# Patient Record
Sex: Female | Born: 1975 | Race: Black or African American | Hispanic: No | Marital: Single
Health system: Southern US, Community
[De-identification: ages and names within clinical notes are randomized; demographics above are authoritative.]

## PROBLEM LIST (undated history)

## (undated) DIAGNOSIS — E079 Disorder of thyroid, unspecified: Secondary | ICD-10-CM

## (undated) DIAGNOSIS — G43909 Migraine, unspecified, not intractable, without status migrainosus: Secondary | ICD-10-CM

## (undated) DIAGNOSIS — F410 Panic disorder [episodic paroxysmal anxiety] without agoraphobia: Secondary | ICD-10-CM

## (undated) HISTORY — PX: WISDOM TOOTH EXTRACTION: SHX21

## (undated) HISTORY — PX: ESSURE TUBAL LIGATION: SUR464

---

## 2005-09-26 ENCOUNTER — Emergency Department (HOSPITAL_COMMUNITY): Admission: EM | Admit: 2005-09-26 | Discharge: 2005-09-26 | Payer: Self-pay | Admitting: Emergency Medicine

## 2005-12-08 ENCOUNTER — Emergency Department (HOSPITAL_COMMUNITY): Admission: EM | Admit: 2005-12-08 | Discharge: 2005-12-08 | Payer: Self-pay | Admitting: Emergency Medicine

## 2006-02-26 ENCOUNTER — Emergency Department (HOSPITAL_COMMUNITY): Admission: EM | Admit: 2006-02-26 | Discharge: 2006-02-26 | Payer: Self-pay | Admitting: Family Medicine
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
## 2014-08-19 ENCOUNTER — Emergency Department (HOSPITAL_COMMUNITY)
Admission: EM | Admit: 2014-08-19 | Discharge: 2014-08-19 | Disposition: A | Discharge status: Home / Self Care | Payer: Medicaid Other | Attending: Emergency Medicine | Admitting: Emergency Medicine

## 2014-08-19 ENCOUNTER — Encounter (HOSPITAL_COMMUNITY): Payer: Self-pay

## 2014-08-19 DIAGNOSIS — Z8679 Personal history of other diseases of the circulatory system: Secondary | ICD-10-CM | POA: Diagnosis not present

## 2014-08-19 DIAGNOSIS — Z5900 Homelessness unspecified: Secondary | ICD-10-CM

## 2014-08-19 DIAGNOSIS — E039 Hypothyroidism, unspecified: Secondary | ICD-10-CM | POA: Diagnosis not present

## 2014-08-19 DIAGNOSIS — Z8659 Personal history of other mental and behavioral disorders: Secondary | ICD-10-CM | POA: Insufficient documentation

## 2014-08-19 DIAGNOSIS — G44209 Tension-type headache, unspecified, not intractable: Secondary | ICD-10-CM | POA: Insufficient documentation

## 2014-08-19 DIAGNOSIS — R51 Headache: Secondary | ICD-10-CM | POA: Diagnosis present

## 2014-08-19 DIAGNOSIS — Z79899 Other long term (current) drug therapy: Secondary | ICD-10-CM | POA: Diagnosis not present

## 2014-08-19 HISTORY — DX: Migraine, unspecified, not intractable, without status migrainosus: G43.909

## 2014-08-19 HISTORY — DX: Panic disorder (episodic paroxysmal anxiety): F41.0

## 2014-08-19 HISTORY — DX: Disorder of thyroid, unspecified: E07.9

## 2014-08-19 MED ORDER — DIPHENHYDRAMINE HCL 25 MG PO CAPS
50.0000 mg | ORAL_CAPSULE | Freq: Once | ORAL | Status: AC
  Administered 2014-08-19: 50 mg via ORAL
  Filled 2014-08-19: qty 2

## 2014-08-19 MED ORDER — METOCLOPRAMIDE HCL 10 MG PO TABS
10.0000 mg | ORAL_TABLET | Freq: Once | ORAL | Status: AC
  Administered 2014-08-19: 10 mg via ORAL
  Filled 2014-08-19 (×2): qty 1

## 2014-08-19 MED ORDER — ACETAMINOPHEN 500 MG PO TABS
1000.0000 mg | ORAL_TABLET | Freq: Once | ORAL | Status: AC
  Administered 2014-08-19: 1000 mg via ORAL
  Filled 2014-08-19: qty 2

## 2014-08-19 NOTE — ED Notes (Signed)
Pt d/c back to shelter with cab voucher
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
## 2014-08-19 NOTE — ED Notes (Signed)
Pt reports migraine x2 days, states it is stress induced. Reports blurred vision and dizziness and nausea. States this is similar to previous migraines. Also requesting psych eval to keep her place at the shelter.

## 2014-08-19 NOTE — Discharge Instructions (Signed)
Do not hesitate to return to the emergency room for any new, worsening or concerning symptoms.  Please obtain primary care using resource guide below. But the minute you were seen in the emergency room and that they will need to obtain records for further outpatient management.   Tension Headache A tension headache is a feeling of pain, pressure, or aching often felt over the front and sides of the head. The pain can be dull or can feel tight (constricting). It is the most common type of headache. Tension headaches are not normally associated with nausea or vomiting and do not get worse with physical activity. Tension headaches can last 30 minutes to several days.  CAUSES  The exact cause is not known, but it may be caused by chemicals and hormones in the brain that lead to pain. Tension headaches often begin after stress, anxiety, or depression. Other triggers may include:  Alcohol.  Caffeine (too much or withdrawal).  Respiratory infections (colds, flu, sinus infections).  Dental problems or teeth clenching.  Fatigue.  Holding your head and neck in one position too long while using a computer. SYMPTOMS   Pressure around the head.   Dull, aching head pain.   Pain felt over the front and sides of the head.   Tenderness in the muscles of the head, neck, and shoulders. DIAGNOSIS  A tension headache is often diagnosed based on:   Symptoms.   Physical examination.   A CT scan or MRI of your head. These tests may be ordered if symptoms are severe or unusual. TREATMENT  Medicines may be given to help relieve symptoms.  HOME CARE INSTRUCTIONS   Only take over-the-counter or prescription medicines for pain or discomfort as directed by your caregiver.   Lie down in a dark, quiet room when you have a headache.   Keep a journal to find out what may be triggering your headaches. For example, write down:  What you eat and drink.  How much sleep you get.  Any change to  your diet or medicines.  Try massage or other relaxation techniques.   Ice packs or heat applied to the head and neck can be used. Use these 3 to 4 times per day for 15 to 20 minutes each time, or as needed.   Limit stress.   Sit up straight, and do not tense your muscles.   Quit smoking if you smoke.  Limit alcohol use.  Decrease the amount of caffeine you drink, or stop drinking caffeine.  Eat and exercise regularly.  Get 7 to 9 hours of sleep, or as recommended by your caregiver.  Avoid excessive use of pain medicine as recurrent headaches can occur.  SEEK MEDICAL CARE IF:   You have problems with the medicines you were prescribed.  Your medicines do not work.  You have a change from the usual headache.  You have nausea or vomiting. SEEK IMMEDIATE MEDICAL CARE IF:   Your headache becomes severe.  You have a fever.  You have a stiff neck.  You have loss of vision.  You have muscular weakness or loss of muscle control.  You lose your balance or have trouble walking.  You feel faint or pass out.  You have severe symptoms that are different from your first symptoms. MAKE SURE YOU:   Understand these instructions.  Will watch your condition.  Will get help right away if you are not doing well or get worse. Document Released: 06/05/2005 Document Revised: 08/28/2011 Document Reviewed:  05/26/2011 ExitCare Patient Information 2015 [place], [place]. This information is not intended to replace advice given to you by your health care provider. Make sure you discuss any questions you have with your health care provider.  Emergency Department Resource Guide 1) Find a Doctor and Pay Out of Pocket Although you won't have to find out who is covered by your insurance plan, it is a good idea to ask around and get recommendations. You will then need to call the office and see if the doctor you have chosen will accept you as a new patient and what types of options they  offer for patients who are self-pay. Some doctors offer discounts or will set up payment plans for their patients who do not have insurance, but you will need to ask so you aren't surprised when you get to your appointment.  2) Contact Your Local Health Department Not all health departments have doctors that can see patients for sick visits, but many do, so it is worth a call to see if yours does. If you don't know where your local health department is, you can check in your phone book. The CDC also has a tool to help you locate your state's health department, and many state websites also have listings of all of their local health departments.  3) Find a Walk-in Clinic If your illness is not likely to be very severe or complicated, you may want to try a walk in clinic. These are popping up all over the country in pharmacies, drugstores, and shopping centers. They're usually staffed by nurse practitioners or physician assistants that have been trained to treat common illnesses and complaints. They're usually fairly quick and inexpensive. However, if you have serious medical issues or chronic medical problems, these are probably not your best option.  No Primary Care Doctor: - Call Health Connect at  [phone] - they can help you locate a primary care doctor that  accepts your insurance, provides certain services, etc. - Physician Referral Service- [phone]  Chronic Pain Problems: Organization         Address  Phone   Notes  [facility]  [phone] Patients need to be referred by their primary care doctor.   Medication Assistance: Organization         Address  Phone   Notes  [facility] Medication [facility] [address] [place] [place], [place] [address] [phone] --Must be a resident of [facility] -- Must have NO insurance coverage whatsoever (no Medicaid/ Medicare, etc.) -- The pt. MUST have a primary care doctor that directs their care  regularly and follows them in the community   [facility]ist  [phone]   [employer]  [phone]    Agencies that provide inexpensive medical care: Organization         Address  Phone   Notes  [facility]  [phone]   [facility]    [phone]   [facility] [address] [place], [place] [address] [phone]   [facility] [address], [place] [phone]   Planned Parenthood    [phone]   [facility]    [phone]   [facility]  [address], [place] Phone:  [phone], Fax:  [phone] Hours of Operation:  9 am - 6 pm, M-F.  Also accepts Medicaid/Medicare and self-pay.  [facility]  [address], [place] Phone: [phone], Fax: [phone]. Hours of Operation:  8:30 am - 5:30 pm, M-F.  Also accepts Medicaid and self-pay.  [facility] [place] [address], [place] Phone: [phone]   [facility] [address] Natasha Bence [place], [place] [phone], Ext. [phone] Mondays & Thursdays: 7-9 AM.  First 15 patients are seen on a first come, first serve basis.    Medicaid-accepting [facility] Providers:  Organization         Address  Phone   Notes  [facility] [address], [place] [phone] Also accepts self-pay patients.  [facility] [address] Laurell Josephs [place], [place]  [phone]   [facility] [address], [place] [phone]   [facility] [address], [place] [phone]   Renaye Rakers [address], [place]   [phone] Only accepts [place] Access [place] patients after they have their name applied to their card.   Self-Pay (no insurance) in [facility]:  Organization         Address  Phone    Notes  Sickle Cell Patients, [facility] [address] [place], [place] [phone]   [facility] [address] [employer], [place] [phone]   [facility] [place]  [address], [place] [phone]   [facility]/Dr. Osei-Bonsu  [address] [place] or [address], [place] [phone] Phone number for both [place] and [place] locations is the same.  Urgent Medical and [facility] [address], [place] [phone]   [facility] [address], [place] or [address] [phone]   [facility] [address] [place] [phone], phone; [phone], fax Sees patients 1st and 3rd Saturday of every month.  Must not qualify for public or private insurance (i.e. Medicaid, Medicare, [place] Health Choice, Veterans' Benefits)  Household income should be no more than 200% of the poverty level The clinic cannot treat you if you are pregnant or think you are pregnant  Sexually transmitted diseases are not treated at the clinic.    Dental Care: Organization         Address  Phone  Notes  [facility] Department of [facility] [address] [place], [place] [phone] Accepts children up to age 31 who are enrolled in [place] or [place] Health Choice; pregnant women with a Medicaid card; and children who have applied for Medicaid or [place] Health Choice, but were declined, whose parents can pay a reduced fee at time of service.  [facility] Department of [facility]  [address], [place] [phone] Accepts children up to age 52 who are enrolled in [place] or [place] Health Choice; pregnant women with a Medicaid card; and children who have applied for Medicaid or [place] Health Choice, but were declined, whose parents can pay a reduced fee at time of service.  [facility]  [address] [place], [place] [phone] Patients are seen by appointment only. Walk-ins are not accepted. [facility] will see patients 32 years of age and older. Monday - Tuesday (8am-5pm) Most Wednesdays (8:30-5pm) $30 per visit, cash  only  [facility]  [address], [facility] [place] [phone] Patients are seen by appointment only. Walk-ins are not accepted. [facility] will see patients 37 years of age and older. One Wednesday Evening (Monthly: Volunteer Based).  $30 per visit, cash only  [employer] [facility]  [phone] for adults; Children under age 484, call Graduate Pediatric Dentistry at [phone]. Children aged 364-14, please call [phone] to request a pediatric application.  Dental services are provided in all areas of dental care including fillings, crowns and bridges, complete and partial dentures, implants, gum treatment, root canals, and extractions. Preventive care is also provided. Treatment is provided to both adults and children. Patients are selected via a lottery and there is often a waiting list.   [facility] [address], [place]  [phone] [email or web address]   [facility] [address], [place], [place] [phone], Ext. [phone] Second and Fourth Thursday of each month, opens at 6:30 AM; Clinic ends at 9 AM.  Patients are seen on a first-come first-served basis, and a limited number are seen during each clinic.   [facility]  [address], [place], [place] [phone]   Eligibility Requirements You must have lived in [place], [place], or [place] counties for at least the last three months.   You cannot be eligible for state or federal sponsored [place]healthcare insurance, including CIGNAVeterans Administration, [place]icaid, or [employer]icare.   You generally cannot be eligible for healthcare insurance through your employer.    How to  apply: Eligibility screenings are held every Tuesday and Wednesday afternoon from 1:00 pm until 4:00 pm. You do not need an appointment for the interview!  [facility] [address], [place], [place] [phone]   [facility]  [phone]   [facility]  [phone]   [facility]  [phone]    Behavioral Health Resources in the Community: Intensive Outpatient Programs Organization         Address  Phone  Notes  [facility] [address], [place], [place] [phone]   [facility] [address], [place], [place] [phone]   [facility] [address], [place], [place]  [phone]   [facility] [address],  [place], [place] [phone] or [phone]   Substance Abuse Resources Organization         Address  Phone  Notes  [facility]  [phone]   [facility]  [phone]   [facility]  [phone]   [facility]  [phone]   Residential & Outpatient Substance Abuse Program  [phone]   Psychological Services Organization         Address  Phone  Notes  [facility]  [phone]- [phone]   [facility]  [phone]- [phone]   [facility] [address], [place] [phone] or [phone]    Mobile Crisis Teams Organization         Address  Phone  Notes  [facility], Mobile Crisis Care Unit  [phone]   [facility]  [address] [place], [place] [phone]   Doristine LocksSharon DeEsch [address] [place] [phone]    Self-Help/Support Groups Organization         Address  Phone  Notes  [facility] [place] - variety of support groups  Mora Call for more information  Narcotics Anonymous (NA), [facility] [address], [place]  2 meetings at this location   Special educational needs teacher         Address  Phone  Notes  [facility] [place],    [place]  [phone]   [facility]  [address], [place] [phone], [place], [place]   [place], [place] [phone] Admissions: 8am-3pm M-F  Incentives Substance [place] [address]    [place], [place] [phone]   [facility] [address] [place], [place], [place]   [facility] [address],  [place], [place]   [facility] - Intensive Outpatient [address], [place], [place]   [facility] ([place].,  [place], [place] [phone] or [phone]   [facility] ([facility]) [address] [place], [place] Accepts Medicaid  [facility] [place] [address],  [employer] [place] [phone] Substance Abuse/Addiction Treatment   [facility] Organization         Address  Phone  Notes  [facility]  [phone]   Domenic Schwab, PhD [address] Arlis Porta [place], [place]   [phone] or [phone]   [address] [place], [place] [phone]   [facility] [address] [place], [place] [phone] Insurance/Medicaid/sponsorship through [facility] and Families [address] [place]                                    [place], [place] [phone] [place] [address], [place] [phone]    Dr. Adele Schilder  [phone]   [facility] Dept. 1) [address] [place] 2) [place] 3)  [place] [address], [place] [phone]  [phone]   [place]  [phone] or [phone] (After Hours)
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
